# Patient Record
Sex: Male | Born: 1955 | Race: White | Hispanic: No | Marital: Married | Smoking: Current every day smoker
Health system: Southern US, Community
[De-identification: ages and names within clinical notes are randomized; demographics above are authoritative.]

## PROBLEM LIST (undated history)

## (undated) DIAGNOSIS — I10 Essential (primary) hypertension: Secondary | ICD-10-CM

## (undated) DIAGNOSIS — E785 Hyperlipidemia, unspecified: Secondary | ICD-10-CM

## (undated) DIAGNOSIS — G2581 Restless legs syndrome: Secondary | ICD-10-CM

## (undated) DIAGNOSIS — K219 Gastro-esophageal reflux disease without esophagitis: Secondary | ICD-10-CM

## (undated) HISTORY — PX: TONSILLECTOMY: SUR1361

## (undated) HISTORY — PX: ANKLE SURGERY: SHX546

---

## 2011-06-03 ENCOUNTER — Ambulatory Visit: Payer: Self-pay | Admitting: Internal Medicine

## 2011-06-11 ENCOUNTER — Ambulatory Visit: Payer: Self-pay | Admitting: Family Medicine

## 2011-07-06 ENCOUNTER — Emergency Department: Payer: Self-pay | Admitting: Emergency Medicine

## 2012-09-17 ENCOUNTER — Ambulatory Visit: Payer: Self-pay | Admitting: Gastroenterology

## 2013-07-29 ENCOUNTER — Ambulatory Visit: Payer: Self-pay | Admitting: Family Medicine

## 2014-06-10 ENCOUNTER — Ambulatory Visit
Admission: EM | Admit: 2014-06-10 | Discharge: 2014-06-10 | Disposition: A | Discharge status: Home / Self Care | Payer: Worker's Compensation | Attending: Family Medicine | Admitting: Family Medicine

## 2014-06-10 ENCOUNTER — Ambulatory Visit: Payer: Worker's Compensation

## 2014-06-10 ENCOUNTER — Encounter: Payer: Self-pay | Admitting: Emergency Medicine

## 2014-06-10 DIAGNOSIS — K219 Gastro-esophageal reflux disease without esophagitis: Secondary | ICD-10-CM | POA: Diagnosis not present

## 2014-06-10 DIAGNOSIS — W19XXXA Unspecified fall, initial encounter: Secondary | ICD-10-CM | POA: Diagnosis not present

## 2014-06-10 DIAGNOSIS — I10 Essential (primary) hypertension: Secondary | ICD-10-CM | POA: Insufficient documentation

## 2014-06-10 DIAGNOSIS — S0101XA Laceration without foreign body of scalp, initial encounter: Secondary | ICD-10-CM | POA: Insufficient documentation

## 2014-06-10 DIAGNOSIS — S01311A Laceration without foreign body of right ear, initial encounter: Secondary | ICD-10-CM

## 2014-06-10 DIAGNOSIS — E785 Hyperlipidemia, unspecified: Secondary | ICD-10-CM | POA: Diagnosis not present

## 2014-06-10 DIAGNOSIS — G2581 Restless legs syndrome: Secondary | ICD-10-CM | POA: Diagnosis not present

## 2014-06-10 DIAGNOSIS — S0093XA Contusion of unspecified part of head, initial encounter: Secondary | ICD-10-CM | POA: Diagnosis not present

## 2014-06-10 DIAGNOSIS — S51811A Laceration without foreign body of right forearm, initial encounter: Secondary | ICD-10-CM | POA: Insufficient documentation

## 2014-06-10 DIAGNOSIS — S51011A Laceration without foreign body of right elbow, initial encounter: Secondary | ICD-10-CM | POA: Diagnosis not present

## 2014-06-10 HISTORY — DX: Hyperlipidemia, unspecified: E78.5

## 2014-06-10 HISTORY — DX: Gastro-esophageal reflux disease without esophagitis: K21.9

## 2014-06-10 HISTORY — DX: Restless legs syndrome: G25.81

## 2014-06-10 HISTORY — DX: Essential (primary) hypertension: I10

## 2014-06-10 MED ORDER — PENICILLIN V POTASSIUM 500 MG PO TABS: 500.0000 mg | ORAL_TABLET | Freq: Three times a day (TID) | ORAL | Status: AC

## 2014-06-10 MED ORDER — KETOROLAC TROMETHAMINE 60 MG/2ML IM SOLN
60.0000 mg | Freq: Once | INTRAMUSCULAR | Status: AC
  Administered 2014-06-10: 60 mg via INTRAMUSCULAR

## 2014-06-10 NOTE — ED Notes (Signed)
Patient presents here with c/o injury to the head/ rt elbow and finger from the fall this am at 10.30, states that he fell off the ladder about 2 f height- fell backwards and landed on the concrete floor and the head hit the metal steps, denies any LOC, denies any dizziness before fall

## 2014-06-10 NOTE — Discharge Instructions (Signed)

## 2014-06-10 NOTE — ED Provider Notes (Signed)
CSN: [id number]     Arrival date & time 06/10/14  1114 History   First MD Initiated Contact with Patient 06/10/14 1320     Chief Complaint  Patient presents with  . Fall   (Consider location/radiation/quality/duration/timing/severity/associated sxs/prior Treatment) HPI Comments: 59 yo presents with a h/o fall today at work. States he fell off a 2 foot step ladder, landing on his right side hitting the right side of his head, earlobe, forearm and elbow on some metal steps. Denies loss of consciousness, vision problems, vomiting. States felt slightly "woozy". States his last tetanus vaccine was last year.   The history is provided by the patient.    Past Medical History  Diagnosis Date  . Hypertension   . Hyperlipidemia   . GERD (gastroesophageal reflux disease)   . Restless leg syndrome    Past Surgical History  Procedure Laterality Date  . Tonsillectomy    . Ankle surgery     History reviewed. No pertinent family history. History  Substance Use Topics  . Smoking status: Current Every Day Smoker -- 0.75 packs/day for 10 years  . Smokeless tobacco: Not on file  . Alcohol Use: No    Review of Systems  Allergies  Review of patient's allergies indicates no known allergies.  Home Medications   Prior to Admission medications   Medication Sig Start Date End Date Taking? Authorizing Provider  carbamazepine (TEGRETOL XR) 400 MG 12 hr tablet Take 400 mg by mouth 2 (two) times daily.   Yes Historical Provider, MD  esomeprazole (NEXIUM) 40 MG capsule Take 40 mg by mouth daily at 12 noon.   Yes Historical Provider, MD  lisinopril (PRINIVIL,ZESTRIL) 40 MG tablet Take 40 mg by mouth daily.   Yes Historical Provider, MD  simvastatin (ZOCOR) 40 MG tablet Take 40 mg by mouth daily.   Yes Historical Provider, MD  penicillin v potassium (VEETID) 500 MG tablet Take 1 tablet (500 mg total) by mouth 3 (three) times daily. 06/10/14   Payton Mccallumrlando Leota Maka, MD   BP 119/78 mmHg  Pulse 71  Temp(Src)  97.5 F (36.4 C) (Oral)  Resp 18  Ht 5\' 7"  (1.702 m)  Wt 215 lb (97.523 kg)  BMI 33.67 kg/m2  SpO2 96% Physical Exam  Constitutional: He is oriented to person, place, and time. He appears well-developed and well-nourished. No distress.  HENT:  Head: Normocephalic. Head is with laceration (2cm superficial laceration to right lateral scalp just above the top of earlobe; 1cm superficial skin laceration to top of earlobe).    Musculoskeletal: He exhibits tenderness.       Right elbow: He exhibits swelling. Tenderness found. Olecranon process tenderness noted.       Right forearm: He exhibits tenderness and swelling.       Arms: 2cm superficial laceration to right elbow; 2cm superficial laceration to right forearm; extremity neurovascularly intact  Neurological: He is alert and oriented to person, place, and time. He has normal reflexes. He displays normal reflexes. No cranial nerve deficit. He exhibits normal muscle tone. Coordination normal.  Skin: He is not diaphoretic.  Nursing note and vitals reviewed.   ED Course  Procedures (including critical care time) Labs Review Labs Reviewed - No data to display  Imaging Review Dg Elbow Complete Right  06/10/2014   CLINICAL DATA:  Fall off ladder with right elbow laceration and pain. Initial encounter.  EXAM: RIGHT ELBOW - COMPLETE 3+ VIEW  COMPARISON:  None.  FINDINGS: No acute fracture, dislocation or joint effusion is  identified. No bony lesions or significant arthropathy. Focal soft tissue injury identified dorsally overlying the olecranon. No evidence of soft tissue foreign body.  IMPRESSION: No acute fracture.   Electronically Signed   By: Irish Lack M.D.   On: 06/10/2014 14:19   Dg Forearm Right  06/10/2014   CLINICAL DATA:  Fall, laceration  EXAM: RIGHT FOREARM - 2 VIEW  COMPARISON:  None.  FINDINGS: Two views of right forearm submitted. No acute fracture or subluxation. Mild soft tissue swelling noted dorsal mid forearm.   IMPRESSION: No acute fracture or subluxation.  Mild soft tissue swelling.   Electronically Signed   By: Natasha Mead M.D.   On: 06/10/2014 14:18   Dg Finger Ring Right  06/10/2014   CLINICAL DATA:  Fall, laceration  EXAM: RIGHT RING FINGER 2+V  COMPARISON:  None.  FINDINGS: Three views of the right fourth finger submitted. No acute fracture or subluxation. No radiopaque foreign body.  IMPRESSION: Negative.   Electronically Signed   By: Natasha Mead M.D.   On: 06/10/2014 14:16     MDM   1. Fall, initial encounter   2. Head contusion, initial encounter   3. Laceration of right forearm, initial encounter   4. Laceration of right elbow, initial encounter   5. Laceration of scalp, initial encounter   6. Laceration of earlobe, right, initial encounter      (right forearm laceration 2cm; right elbow laceration 2cm; right lateral scalp laceration 2cm; right earlobe laceration 1cm)     Discharge Medication List as of 06/10/2014  4:21 PM    START taking these medications   Details  penicillin v potassium (VEETID) 500 MG tablet Take 1 tablet (500 mg total) by mouth 3 (three) times daily., Starting 06/10/2014, Until Discontinued, Normal      Plan: 1. Test/x-ray results and diagnosis reviewed with patient 2. rx as per orders; risks, benefits, potential side effects reviewed with patient 3. Recommend supportive treatment with otc anagesics prn; rest, elevation of affected extremity 4. Informed consent obtained for laceration repairs; wound cleaned and prepped in sterile fashion; laceration wounds anesthetized with 1% lidocaine without epi; 4 simple interrupted sutures placed with 5.0 Nylon to forearm and elbow lacerations each; 4 staples applied to right scalp laceration; steri strips applied to right upper earlobe laceration; patient tolerated procedures well; wound care instructions given 5. F/u in 1 week for suture/staple removal or prn if symptoms worsen or don't improve (discussed with patient  signs/symptoms to watch for regarding complications)    Payton Mccallum, MD 06/10/14 [phone]

## 2014-06-10 NOTE — ED Notes (Signed)
TDap on 07/29/2013

## 2014-06-16 ENCOUNTER — Ambulatory Visit
Admission: EM | Admit: 2014-06-16 | Discharge: 2014-06-16 | Disposition: A | Discharge status: Home / Self Care | Payer: Self-pay

## 2014-06-16 NOTE — ED Notes (Signed)
Sutures removed from forearm and elbow.  Staples removed from behind right ear.  Areas are healing and aproximated well without any openings

## 2014-07-21 ENCOUNTER — Other Ambulatory Visit: Payer: Self-pay | Admitting: Internal Medicine

## 2014-07-21 DIAGNOSIS — N5089 Other specified disorders of the male genital organs: Secondary | ICD-10-CM

## 2014-07-25 ENCOUNTER — Ambulatory Visit
Admission: RE | Admit: 2014-07-25 | Discharge: 2014-07-25 | Disposition: A | Discharge status: Home / Self Care | Payer: BLUE CROSS/BLUE SHIELD | Source: Ambulatory Visit | Attending: Internal Medicine | Admitting: Internal Medicine

## 2014-07-25 DIAGNOSIS — N5089 Other specified disorders of the male genital organs: Secondary | ICD-10-CM

## 2014-07-25 DIAGNOSIS — I861 Scrotal varices: Secondary | ICD-10-CM | POA: Diagnosis not present

## 2014-07-25 DIAGNOSIS — N503 Cyst of epididymis: Secondary | ICD-10-CM | POA: Insufficient documentation

## 2014-07-25 DIAGNOSIS — N433 Hydrocele, unspecified: Secondary | ICD-10-CM | POA: Insufficient documentation

## 2014-07-25 DIAGNOSIS — N509 Disorder of male genital organs, unspecified: Secondary | ICD-10-CM | POA: Diagnosis present

## 2016-09-01 IMAGING — CR DG ELBOW COMPLETE 3+V*R*
4 series · 4 of 4 positions shown · non-contrast
Comparison: None.

CLINICAL DATA: Fall off ladder with right elbow laceration and
pain. Initial encounter.

EXAM:
RIGHT ELBOW - COMPLETE 3+ VIEW

[elbow ap]
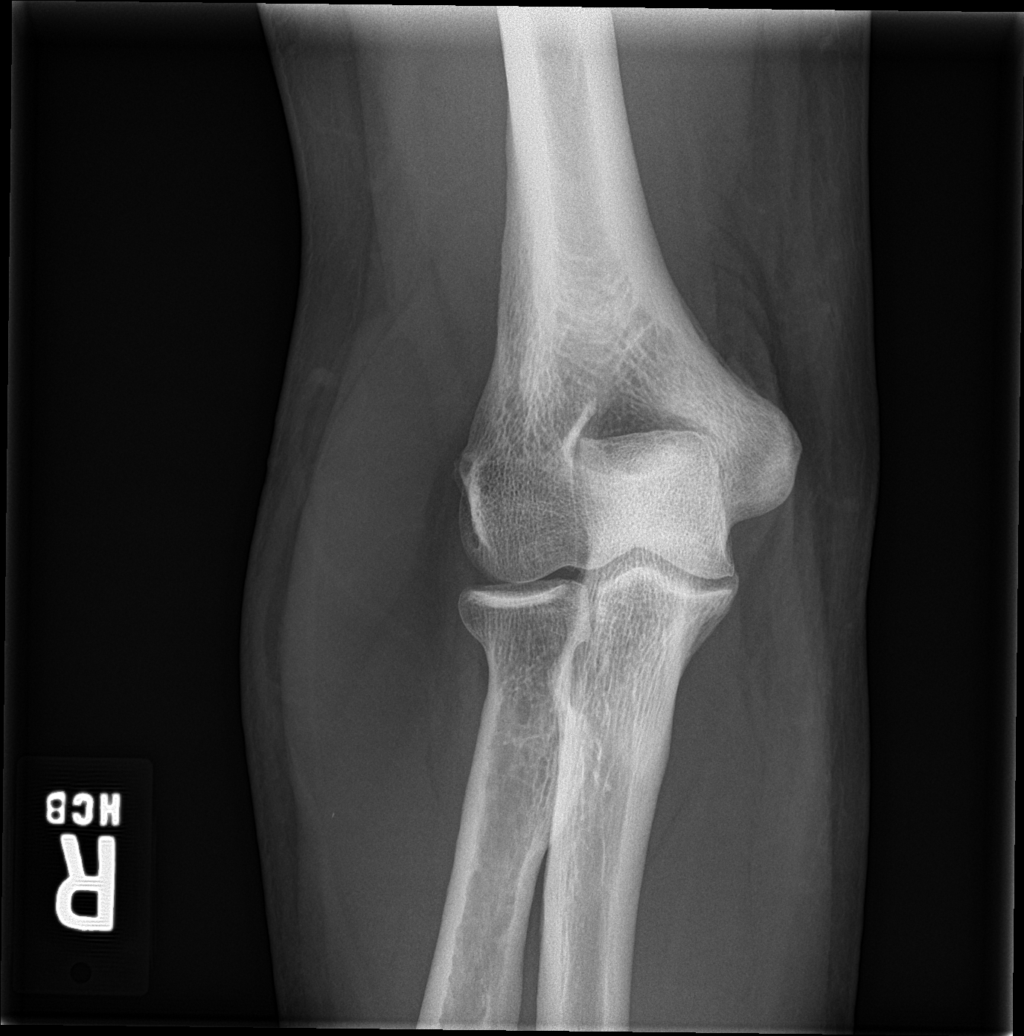

[elbow obl (1 of 2)]
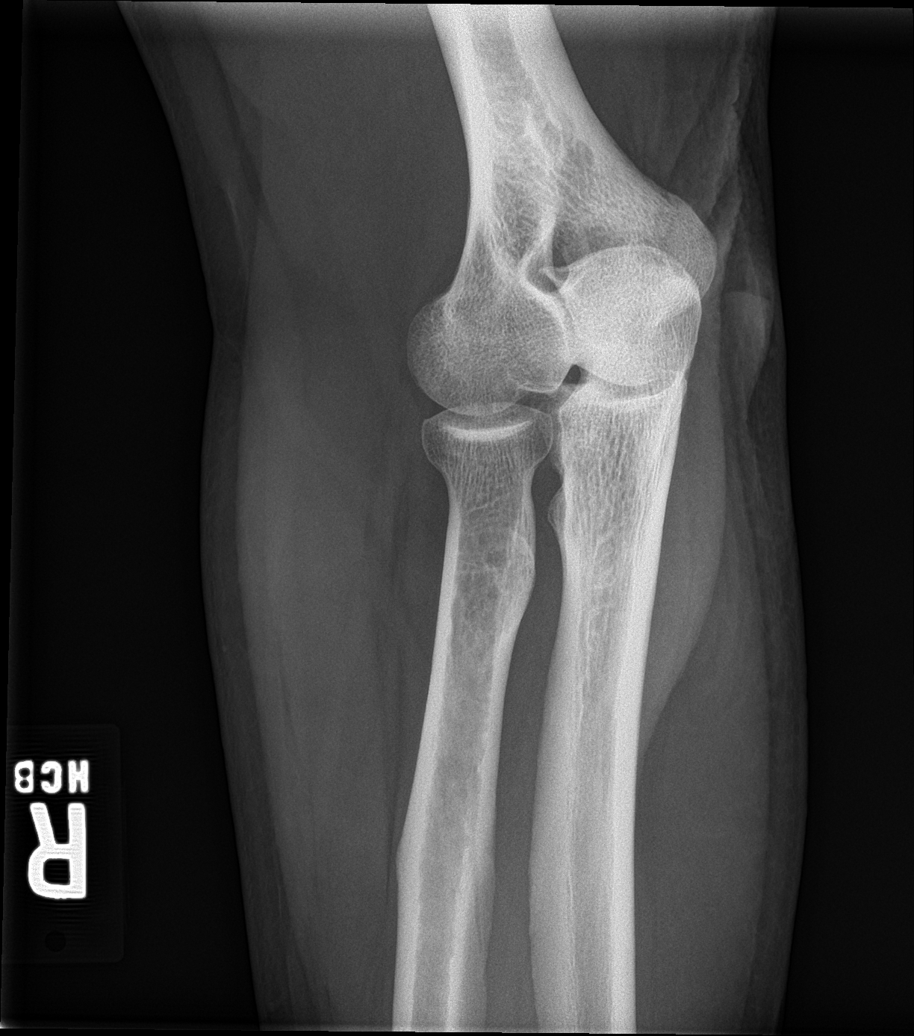

[elbow lat]
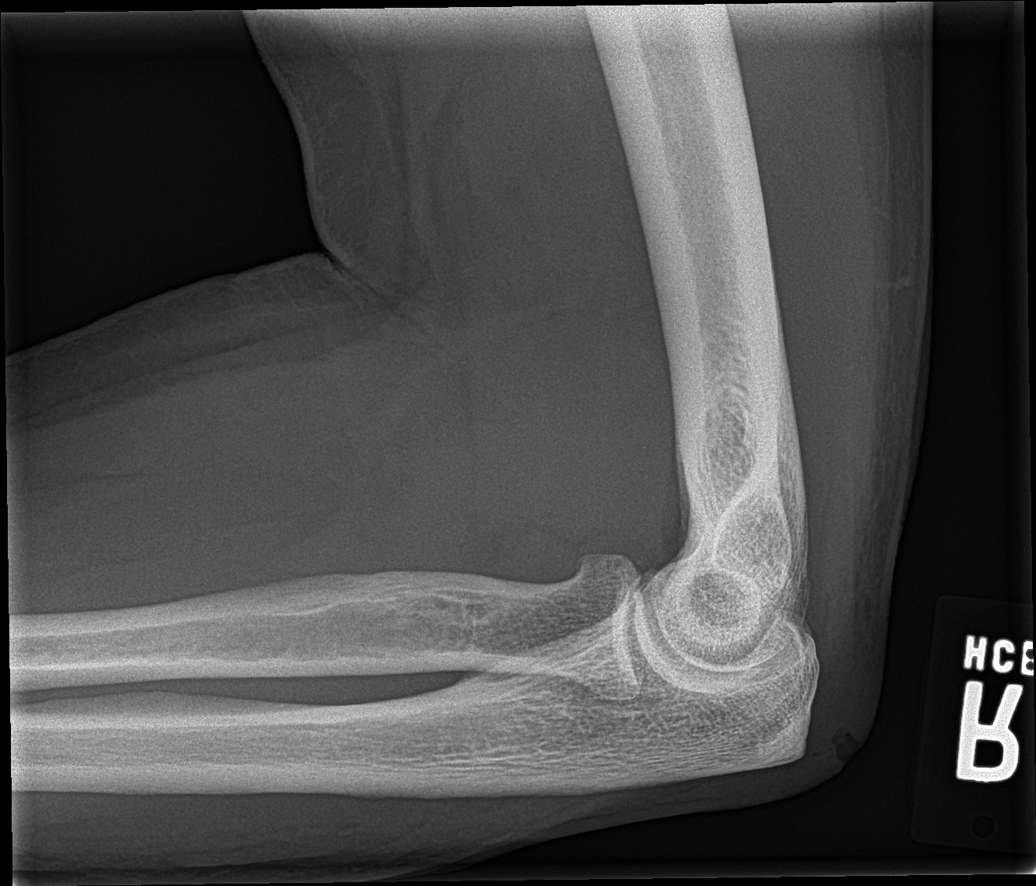

[elbow obl (2 of 2)]
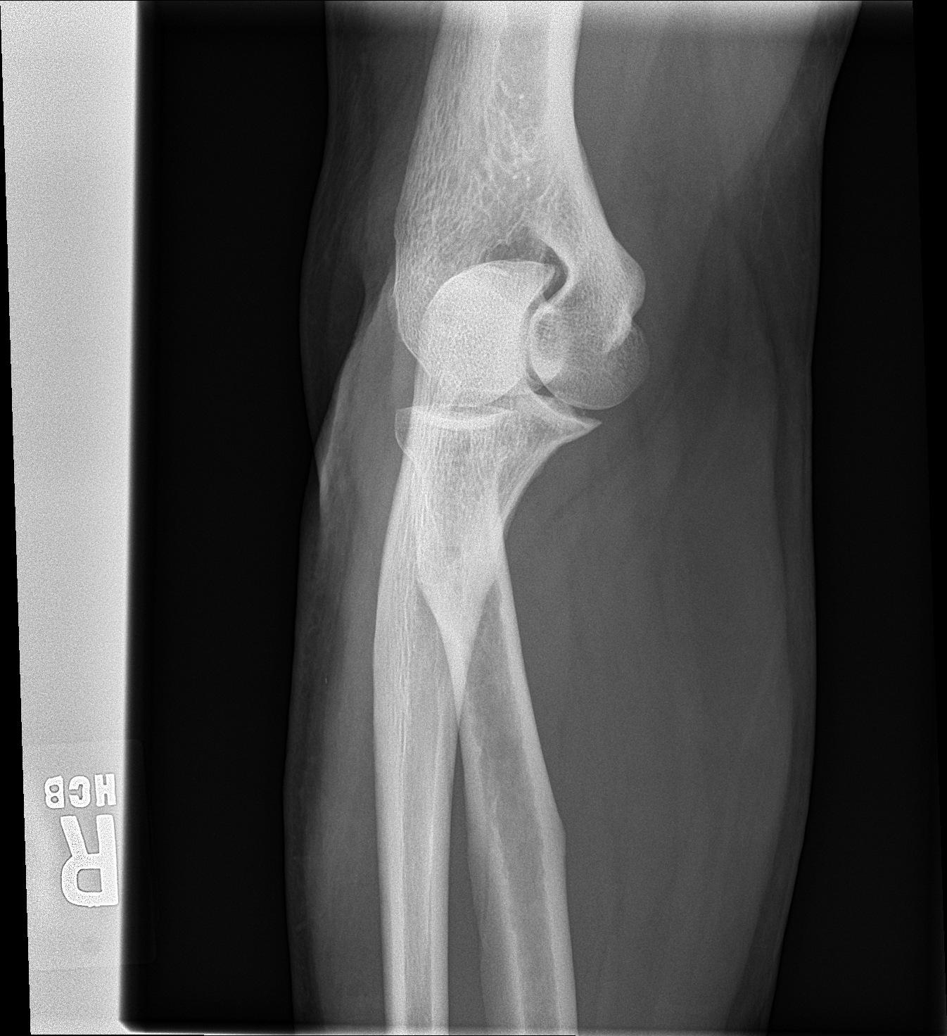

[4 of 4 positions shown; findings below may reference images not displayed]

FINDINGS: No acute fracture, dislocation or joint effusion is identified. No
bony lesions or significant arthropathy. Focal soft tissue injury
identified dorsally overlying the olecranon. No evidence of soft
tissue foreign body.
IMPRESSION: No acute fracture.

## 2016-09-01 IMAGING — CR DG FOREARM 2V*R*
2 series · 2 of 2 positions shown · non-contrast
Comparison: None.

CLINICAL DATA: Fall, laceration

EXAM:
RIGHT FOREARM - 2 VIEW

[forearm ap]
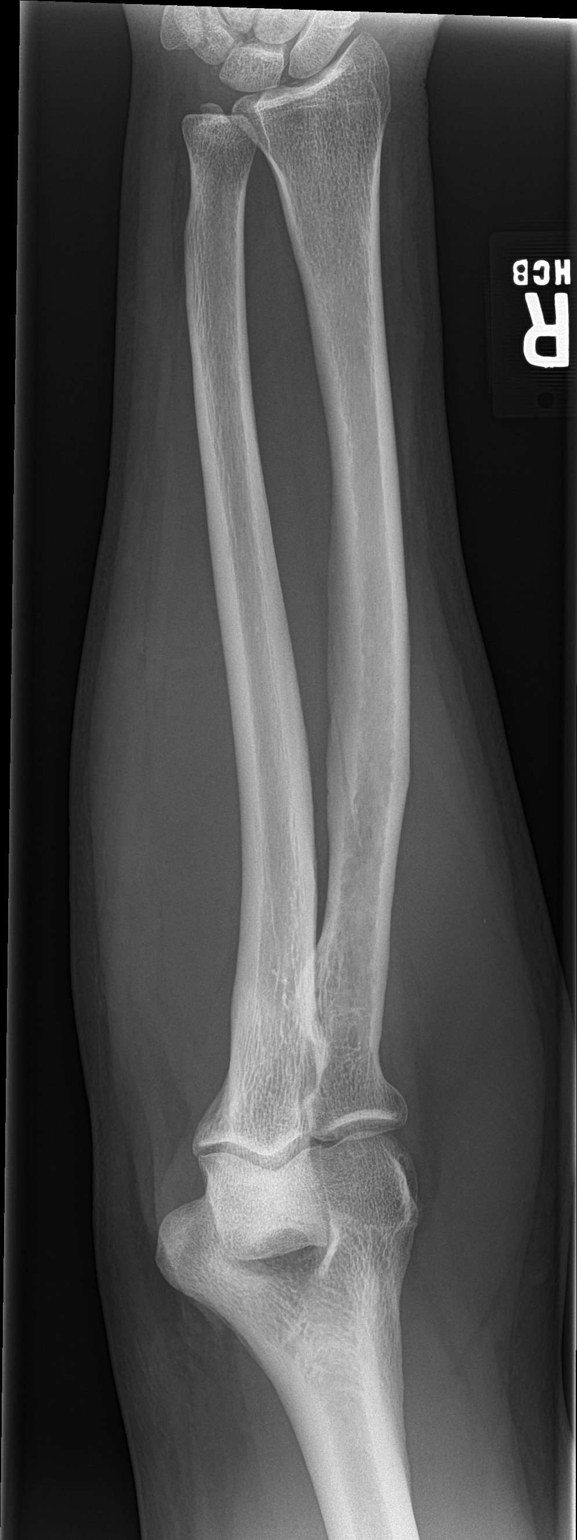

[forearm lat]
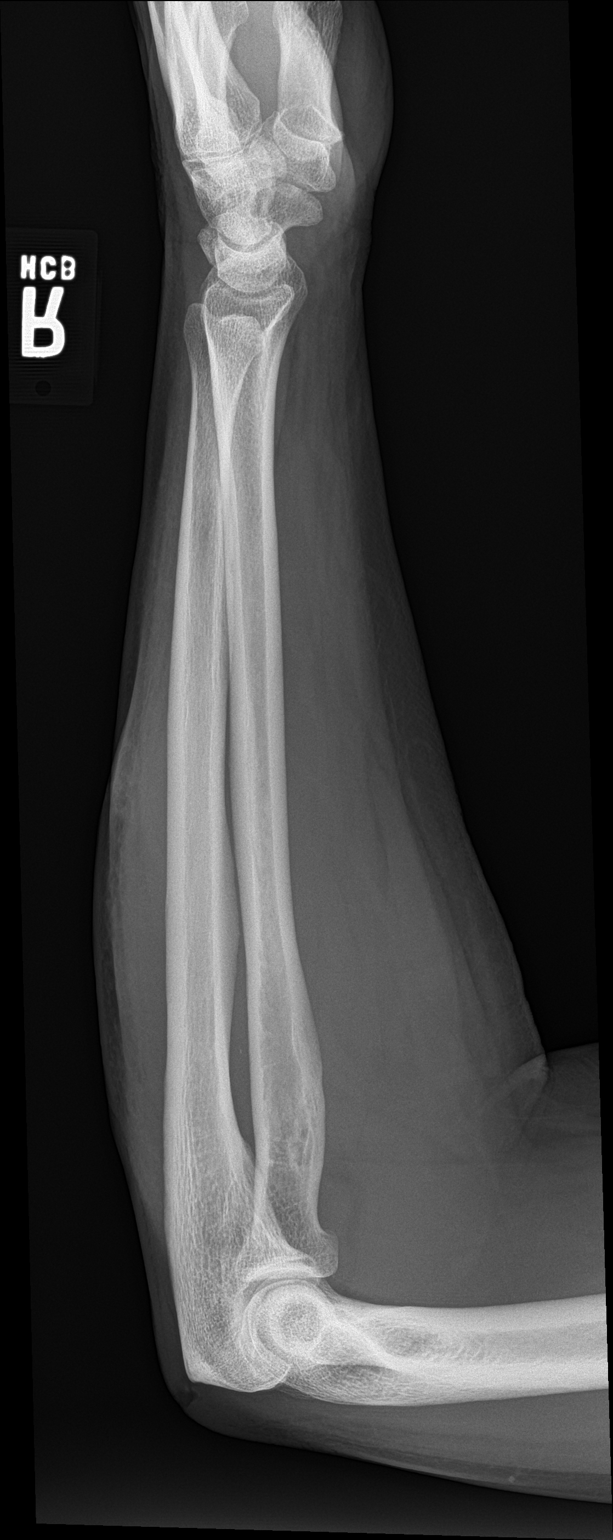

[2 of 2 positions shown; findings below may reference images not displayed]

FINDINGS: Two views of right forearm submitted. No acute fracture or
subluxation. Mild soft tissue swelling noted dorsal mid forearm.
IMPRESSION: No acute fracture or subluxation.  Mild soft tissue swelling.
